# Patient Record
Sex: Female | Born: 1970 | Hispanic: Yes | Marital: Single | Smoking: Never smoker
Health system: Southern US, Community
[De-identification: ages and names within clinical notes are randomized; demographics above are authoritative.]

## PROBLEM LIST (undated history)

## (undated) DIAGNOSIS — I10 Essential (primary) hypertension: Secondary | ICD-10-CM

## (undated) HISTORY — PX: THYROID SURGERY: SHX805

## (undated) HISTORY — PX: CHOLECYSTECTOMY: SHX55

---

## 2018-06-04 ENCOUNTER — Emergency Department: Payer: Medicaid - Out of State

## 2018-06-04 ENCOUNTER — Other Ambulatory Visit: Payer: Self-pay

## 2018-06-04 ENCOUNTER — Emergency Department
Admission: EM | Admit: 2018-06-04 | Discharge: 2018-06-04 | Disposition: A | Discharge status: Home / Self Care | Payer: Medicaid - Out of State | Attending: Emergency Medicine | Admitting: Emergency Medicine

## 2018-06-04 ENCOUNTER — Encounter: Payer: Self-pay | Admitting: Emergency Medicine

## 2018-06-04 DIAGNOSIS — Y92017 Garden or yard in single-family (private) house as the place of occurrence of the external cause: Secondary | ICD-10-CM | POA: Insufficient documentation

## 2018-06-04 DIAGNOSIS — S99912A Unspecified injury of left ankle, initial encounter: Secondary | ICD-10-CM | POA: Diagnosis present

## 2018-06-04 DIAGNOSIS — I10 Essential (primary) hypertension: Secondary | ICD-10-CM | POA: Insufficient documentation

## 2018-06-04 DIAGNOSIS — Y9389 Activity, other specified: Secondary | ICD-10-CM | POA: Insufficient documentation

## 2018-06-04 DIAGNOSIS — Y999 Unspecified external cause status: Secondary | ICD-10-CM | POA: Insufficient documentation

## 2018-06-04 DIAGNOSIS — S60221A Contusion of right hand, initial encounter: Secondary | ICD-10-CM | POA: Insufficient documentation

## 2018-06-04 DIAGNOSIS — W11XXXA Fall on and from ladder, initial encounter: Secondary | ICD-10-CM | POA: Insufficient documentation

## 2018-06-04 DIAGNOSIS — S92012A Displaced fracture of body of left calcaneus, initial encounter for closed fracture: Secondary | ICD-10-CM | POA: Diagnosis not present

## 2018-06-04 DIAGNOSIS — R52 Pain, unspecified: Secondary | ICD-10-CM

## 2018-06-04 HISTORY — DX: Essential (primary) hypertension: I10

## 2018-06-04 LAB — COMPREHENSIVE METABOLIC PANEL
ALT: 38 U/L (ref 0–44)
AST: 39 U/L (ref 15–41)
Albumin: 4.3 g/dL (ref 3.5–5.0)
Alkaline Phosphatase: 80 U/L (ref 38–126)
Anion gap: 7 (ref 5–15)
BILIRUBIN TOTAL: 0.6 mg/dL (ref 0.3–1.2)
BUN: 12 mg/dL (ref 6–20)
CALCIUM: 8.7 mg/dL — AB (ref 8.9–10.3)
CO2: 24 mmol/L (ref 22–32)
CREATININE: 0.54 mg/dL (ref 0.44–1.00)
Chloride: 107 mmol/L (ref 98–111)
Glucose, Bld: 109 mg/dL — ABNORMAL HIGH (ref 70–99)
Potassium: 3.8 mmol/L (ref 3.5–5.1)
Sodium: 138 mmol/L (ref 135–145)
TOTAL PROTEIN: 8.2 g/dL — AB (ref 6.5–8.1)

## 2018-06-04 LAB — CBC WITH DIFFERENTIAL/PLATELET
BASOS ABS: 0 10*3/uL (ref 0–0.1)
BASOS PCT: 1 %
EOS ABS: 0.1 10*3/uL (ref 0–0.7)
EOS PCT: 2 %
HCT: 36 % (ref 35.0–47.0)
HEMOGLOBIN: 12.3 g/dL (ref 12.0–16.0)
Lymphocytes Relative: 14 %
Lymphs Abs: 1.2 10*3/uL (ref 1.0–3.6)
MCH: 27.3 pg (ref 26.0–34.0)
MCHC: 34.1 g/dL (ref 32.0–36.0)
MCV: 80.1 fL (ref 80.0–100.0)
Monocytes Absolute: 0.9 10*3/uL (ref 0.2–0.9)
Monocytes Relative: 10 %
NEUTROS PCT: 73 %
Neutro Abs: 6.3 10*3/uL (ref 1.4–6.5)
PLATELETS: 310 10*3/uL (ref 150–440)
RBC: 4.5 MIL/uL (ref 3.80–5.20)
RDW: 16.1 % — ABNORMAL HIGH (ref 11.5–14.5)
WBC: 8.6 10*3/uL (ref 3.6–11.0)

## 2018-06-04 MED ORDER — HYDROMORPHONE HCL 1 MG/ML IJ SOLN
INTRAMUSCULAR | Status: AC
  Filled 2018-06-04: qty 1

## 2018-06-04 MED ORDER — HYDROMORPHONE HCL 1 MG/ML IJ SOLN
0.5000 mg | Freq: Once | INTRAMUSCULAR | Status: AC
  Administered 2018-06-04: 0.5 mg via INTRAVENOUS

## 2018-06-04 MED ORDER — HYDROMORPHONE HCL 1 MG/ML IJ SOLN
0.5000 mg | Freq: Once | INTRAMUSCULAR | Status: AC
  Administered 2018-06-04: 0.5 mg via INTRAVENOUS
  Filled 2018-06-04: qty 1

## 2018-06-04 MED ORDER — OXYCODONE-ACETAMINOPHEN 5-325 MG PO TABS: 1.0000 | ORAL_TABLET | Freq: Four times a day (QID) | ORAL | 0 refills | Status: AC | PRN

## 2018-06-04 NOTE — Discharge Instructions (Addendum)
[facility]. [facility]ins[facility]owler [facility] on Tuesday 9/24 at 3:45pm.    Ice and elevate foot until seen by podiatry.  Take medication every 6 hours as needed for pain.  Use walker for walking and do not apply weight to your foot.  Bring $100 to the office for visit on Tuesday.  Also [place] for possible referral to podiatry.

## 2018-06-04 NOTE — ED Notes (Signed)
Posterior ocl to left ankle.  Prior to splint webril and ace.  Then splint and ace.  Tolerated well.  Pain 5/10.  All instructions were given via [employer] interpreter maritza.  Including how to use walker and no wt bearing.

## 2018-06-04 NOTE — ED Provider Notes (Signed)
[facility] Emergency Department Provider Note  ____________________________________________   First MD Initiated Contact with Patient 06/04/18 1023     (approximate)  I have reviewed the triage vital signs and the nursing notes.   HISTORY  Chief Complaint Ankle Pain Patient, husband and Spanish interpreter  HPI Anne Lane is a 47 y.o. female is brought in day by her husband after she fell at approximately 10 AM from a ladder while she was cleaning out the gutters.  Patient states that she landed mostly on the left side.  She denies any head injury or loss of consciousness.  Patient states that she fell into the grass.  She has had pain with her ankle since that time.  She also complains of some minimal pain to her right hand.  She denies any dizziness, visual changes, headache, chest pain, nausea, vomiting or abdominal pain.  Patient denies any previous injury to her ankle.  She rates her pain as a 9/10.   Past Medical History:  Diagnosis Date  . Hypertension     There are no active problems to display for this patient.   Past Surgical History:  Procedure Laterality Date  . CHOLECYSTECTOMY    . THYROID SURGERY      Prior to Admission medications   Medication Sig Start Date End Date Taking? Authorizing Provider  oxyCODONE-acetaminophen (PERCOCET) 5-325 MG tablet Take 1 tablet by mouth every 6 (six) hours as needed for severe pain. 06/04/18   Tommi Rumps, PA-C    Allergies Patient has no known allergies.  No family history on file.  Social History Social History   Tobacco Use  . Smoking status: Never Smoker  . Smokeless tobacco: Never Used  Substance Use Topics  . Alcohol use: Not Currently  . Drug use: Not Currently    Review of Systems Constitutional: No fever/chills Eyes: No visual changes. ENT: No trauma. Cardiovascular: Denies chest pain. Respiratory: Denies shortness of breath. Gastrointestinal: No abdominal  pain.  No nausea, no vomiting.   Musculoskeletal: Negative for back pain.  Negative for cervical pain, hip pain, shoulder pain or right leg pain.  Positive for left ankle/foot and right hand pain. Skin: Negative for lacerations or abrasions. Neurological: Negative for headaches, focal weakness or numbness. ___________________________________________   PHYSICAL EXAM:  VITAL SIGNS: ED Triage Vitals  Enc Vitals Group     BP 06/04/18 1014 (!) 166/84     Pulse Rate 06/04/18 1014 79     Resp 06/04/18 1014 18     Temp 06/04/18 1014 98.3 F (36.8 C)     Temp Source 06/04/18 1014 Oral     SpO2 06/04/18 1014 98 %     Weight 06/04/18 1006 150 lb (68 kg)     Height 06/04/18 1006 5\' 4"  (1.626 m)     Head Circumference --      Peak Flow --      Pain Score 06/04/18 1006 9     Pain Loc --      Pain Edu? --      Excl. in GC? --    Constitutional: Alert and oriented. Well appearing and in no acute distress. Eyes: Conjunctivae are normal. PERRL. EOMI. Head: Atraumatic. Nose: No trauma. Neck: No stridor.  No cervical spine tenderness on palpation posteriorly.  Range of motion is without restriction or pain. Cardiovascular: Normal rate, regular rhythm. Grossly normal heart sounds.  Good peripheral circulation. Respiratory: Normal respiratory effort.  No retractions. Lungs CTAB.  Nontender  ribs to palpation posteriorly, laterally or anteriorly.  Soft tissue injury is noted. Gastrointestinal: Soft and nontender. No distention.  No CVA tenderness.  Bowel sounds normal x4 quadrants.  There is no bruising noted. Musculoskeletal: On examination of the left ankle there is marked tenderness and edema noted on the lateral aspect and also the plantar aspect of the left foot.  Skin is intact.  Motor or sensory function intact.  Capillary refill is less than 3 seconds.  Patient has limited range of motion secondary to pain.  There is no tenderness on palpation of the tibia, left knee, femur or compression of the  left hip and pelvis.  Patient is nontender to palpation of the thoracic and lumbar spine.  No soft tissue injury or abrasions were noted.  There is minimal tenderness on palpation of the right first metacarpal.  No soft tissue edema is present.  Patient is able to move all digits without any difficulty and also make a fist.  Capillary refill is less than 3 seconds.  Skin is intact.  Patient is nontender palpation of the wrist, elbow or right shoulder. Neurologic:  Normal speech and language. No gross focal neurologic deficits are appreciated. No gait instability. Skin:  Skin is warm, dry and intact.  No ecchymosis, abrasions, erythema present. Psychiatric: Mood and affect are normal. Speech and behavior are normal.  ____________________________________________   LABS (all labs ordered are listed, but only abnormal results are displayed)  Labs Reviewed  COMPREHENSIVE METABOLIC PANEL - Abnormal; Notable for the following components:      Result Value   Glucose, Bld 109 (*)    Calcium 8.7 (*)    Total Protein 8.2 (*)    All other components within normal limits  CBC WITH DIFFERENTIAL/PLATELET - Abnormal; Notable for the following components:   RDW 16.1 (*)    All other components within normal limits    RADIOLOGY  ED MD interpretation:  Left ankle x-ray is suggestive of a calcaneal fracture.  Dedicated calcaneal films were ordered and confirmed fracture.  Official radiology report(s): Dg Ankle Complete Left  Result Date: 06/04/2018 CLINICAL DATA:  Left ankle pain and swelling. EXAM: LEFT ANKLE COMPLETE - 3+ VIEW COMPARISON:  No recent. FINDINGS: Diffuse soft tissue swelling. Severely comminuted displaced fractures of the calcaneus noted. Fractures extend into the talocalcaneal joint space. IMPRESSION: Severely comminuted and displaced fractures of the calcaneus. Fractures extend into talocalcaneal joint space. Electronically Signed   By: Maisie Fushomas  Register   On: 06/04/2018 10:53   Dg Os  Calcis Left  Result Date: 06/04/2018 CLINICAL DATA:  Patient fell from a ladder while cleaning gutters approximately 30 minutes prior to presentation. The patient is complaining of left ankle pain. EXAM: LEFT OS CALCIS - 2+ VIEW COMPARISON:  Left ankle series of today's date FINDINGS: The patient has sustained a comminuted fracture of the body of the calcaneus. The articulation with the talus appears intact. The other visualized tarsal bones are normal. The distal tibia and fibula appear normal. IMPRESSION: The patient has sustained an acute comminuted fracture of the calcaneus. There is a large amount of soft tissue swelling. Electronically Signed   By: David  SwazilandJordan M.D.   On: 06/04/2018 10:53   Ct Ankle Left Wo Contrast  Result Date: 06/04/2018 CLINICAL DATA:  Evaluate calcaneus fracture.  Fell from a ladder. EXAM: CT OF THE LEFT ANKLE WITHOUT CONTRAST TECHNIQUE: Multidetector CT imaging of the left ankle was performed according to the standard protocol. Multiplanar CT image reconstructions were  also generated. COMPARISON:  Radiographs 06/04/2018 FINDINGS: Severely comminuted and displaced fractures of the calcaneus which has a shattered appearance. This is largely a central depression type fracture with marked depression and posterior rotation of the posterior calcaneal facet laterally. The sustentacular limb is largely intact. Small fractures posteriorly and near the base. The middle talocalcaneal facet is intact. There is a fracture through the base of the anterior process. The tibiotalar joint is maintained. No fracture of the tibia or fibula is identified. No talar fracture. The tarsal bones are intact. IMPRESSION: 1. Severely comminuted and displaced central depression type calcaneal fractures. 2. No other fractures are identified. Electronically Signed   By: Rudie Meyer M.D.   On: 06/04/2018 12:47   Dg Hand Complete Right  Result Date: 06/04/2018 CLINICAL DATA:  Fell 10 feet.  Right hand pain.  EXAM: RIGHT HAND - COMPLETE 3+ VIEW COMPARISON:  None. FINDINGS: Negative for fracture or dislocation in the right hand. No focal soft tissue abnormality. No significant arthropathy. IMPRESSION: No acute bone abnormality to the right hand. Electronically Signed   By: Richarda Overlie M.D.   On: 06/04/2018 12:29   ____________________________________________   PROCEDURES  Procedure(s) performed:   .Splint Application Date/Time: 06/04/2018 3:48 PM Performed by: Sheran Luz, RN Authorized by: Tommi Rumps, PA-C   Consent:    Consent obtained:  Verbal   Consent given by:  Patient and spouse   Risks discussed:  Pain, numbness and swelling   Alternatives discussed:  Referral Pre-procedure details:    Sensation:  Normal   Skin color:  No ecchymosis, abrasions or erythema. Procedure details:    Laterality:  Left   Location:  Ankle   Ankle:  L ankle   Strapping: no     Splint type:  Short leg   Supplies:  Ortho-Glass Post-procedure details:    Pain:  Improved   Sensation:  Normal   Patient tolerance of procedure:  Tolerated well, no immediate complications  Critical Care performed: No ____________________________________________   INITIAL IMPRESSION / ASSESSMENT AND PLAN / ED COURSE  As part of my medical decision making, I reviewed the following data within the electronic MEDICAL RECORD NUMBER Notes from prior ED visits and [place] Controlled Substance Database  Patient presents to the ED with complaint of left ankle pain and right hand pain after she fell off a ladder while cleaning the gutters.  Patient denied any other injuries.  History was obtained with Spanish interpreter present.  Patient denies any head injury or loss of consciousness and spouse is present and confirms the story also.  X-rays of the left ankle is suggestive for a calcaneal fracture and was confirmed with a dedicated film.  CT scan of the calcaneus was obtained.  Right hand x-ray was negative.  Patient was given  Dilaudid 0.5 mg IV prior to x-ray and did not require any further pain medication until the splint was placed.  Dr. Ether Griffins will see the patient in the office on 06/09/2018 at 3:45 PM.  Patient was given a walker and instructed that she is not to bear weight on this foot.  She is also to ice and elevate her foot for the weekend.  Patient was given a prescription for Percocet 1 every 6 hours as needed for pain.  Patient and husband agreed that they will comply with instructions.  ____________________________________________   FINAL CLINICAL IMPRESSION(S) / ED DIAGNOSES  Final diagnoses:  Pain  Closed displaced fracture of body of left calcaneus, initial encounter  Contusion of right hand, initial encounter  Fall from ladder, initial encounter     ED Discharge Orders         Ordered    oxyCODONE-acetaminophen (PERCOCET) 5-325 MG tablet  Every 6 hours PRN     06/04/18 1450           Note:  This document was prepared using Dragon voice recognition software and may include unintentional dictation errors.    Tommi Rumps, PA-C 06/04/18 1741    Phineas Semen, MD 06/05/18 [phone]

## 2018-06-04 NOTE — ED Notes (Signed)
Patient on stretcher with husband at bedside.  Appears relaxed now.  Has foot up on pillow and ice pack in place.

## 2018-06-04 NOTE — ED Triage Notes (Addendum)
Patient to ED via POV into WC.  Patient speaks Spanish, [employer] tradernterpreter used. Patient fell from a ladder this AM while cleaning gutters approximately 10 ft.  This occurred approx. 30 min. Ago, Patient states she landed on her feet mostly on the left side, states she fell onto the grass.  Complaining of pain left ankle and right hand.  Denies LOC, denies back or neck pain at this time. Left ankle visibly swollen.  Ice pack applied.

## 2018-11-08 IMAGING — CT CT ANKLE*L* W/O CM
3 series · 13 of 36 positions shown, 14 images · non-contrast
Comparison: Radiographs 06/04/2018

CLINICAL DATA: Evaluate calcaneus fracture.  Fell from a ladder.

EXAM:
CT OF THE LEFT ANKLE WITHOUT CONTRAST
TECHNIQUE: Multidetector CT imaging of the left ankle was performed according
to the standard protocol. Multiplanar CT image reconstructions were
also generated.

[Series 5: axial st · axial · 0.20mm/px · z∈[+58,+174]mm · 4 of 168 slices shown, 5 images]
[im 26/168  soft-tissue]
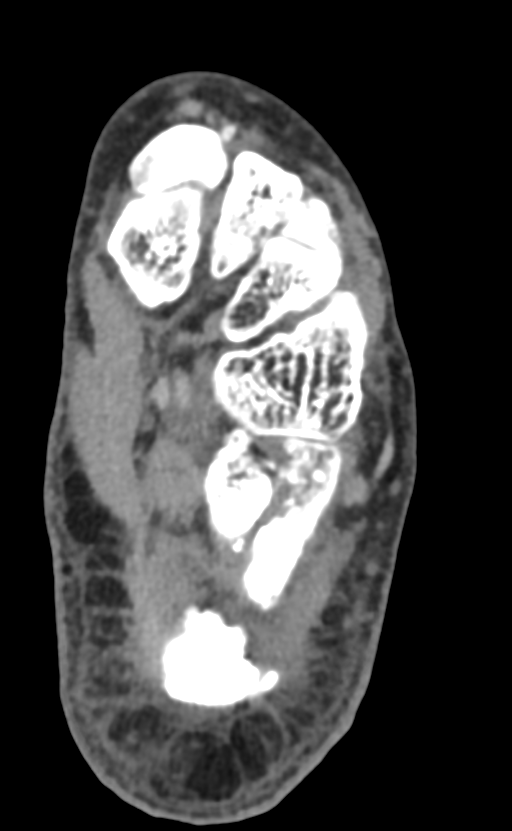
[im 26/168  bone]
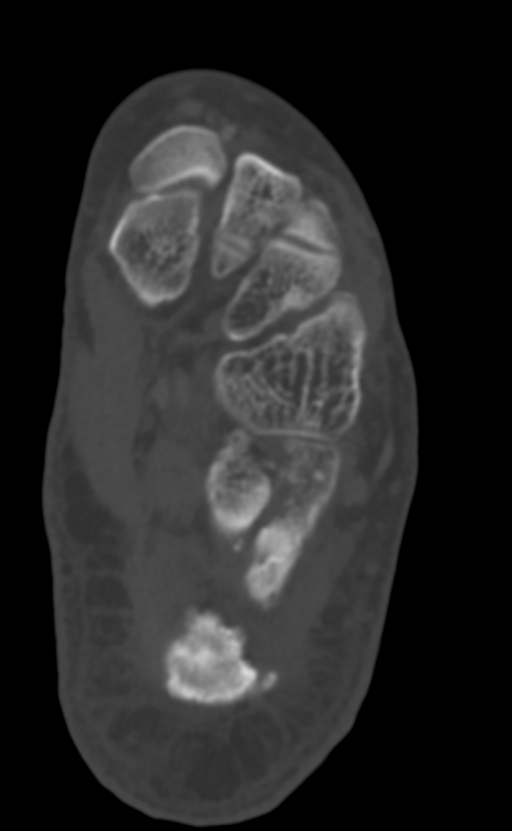
[im 65/168  bone]
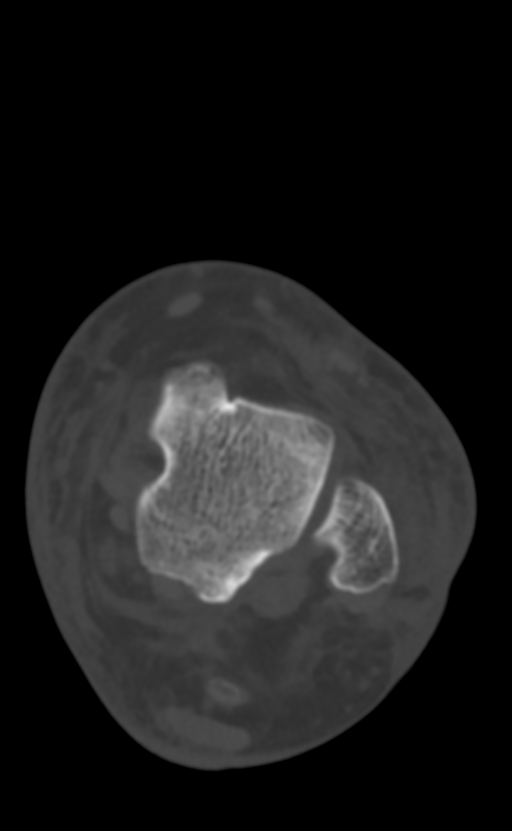
[im 103/168  bone]
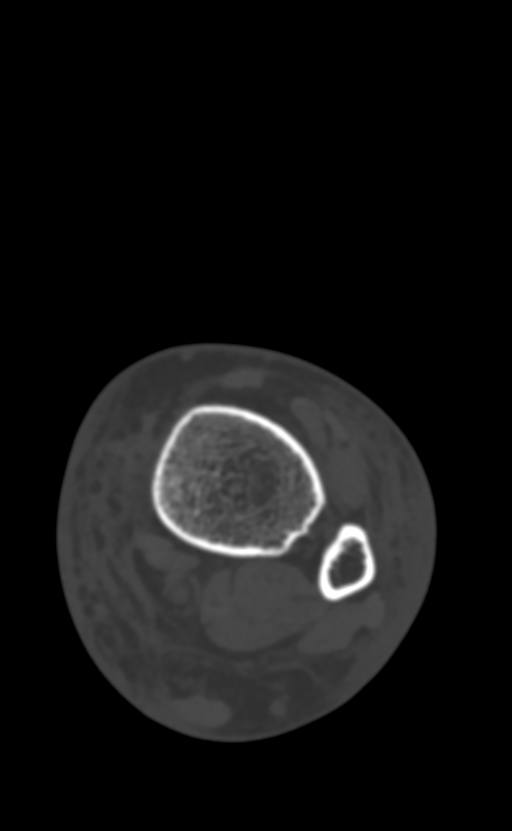
[im 142/168  bone]
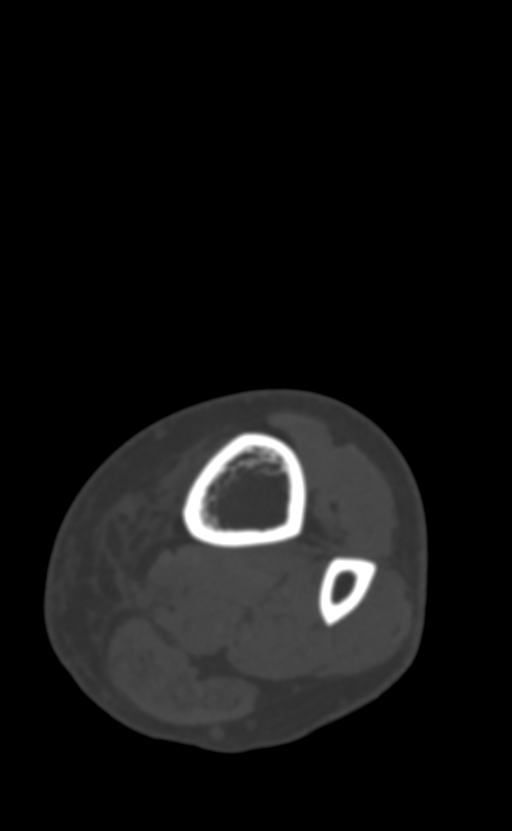

[Series 7: cor st · coronal · 0.19mm/px · 3 of 165 slices shown]
[im 33/165  bone]
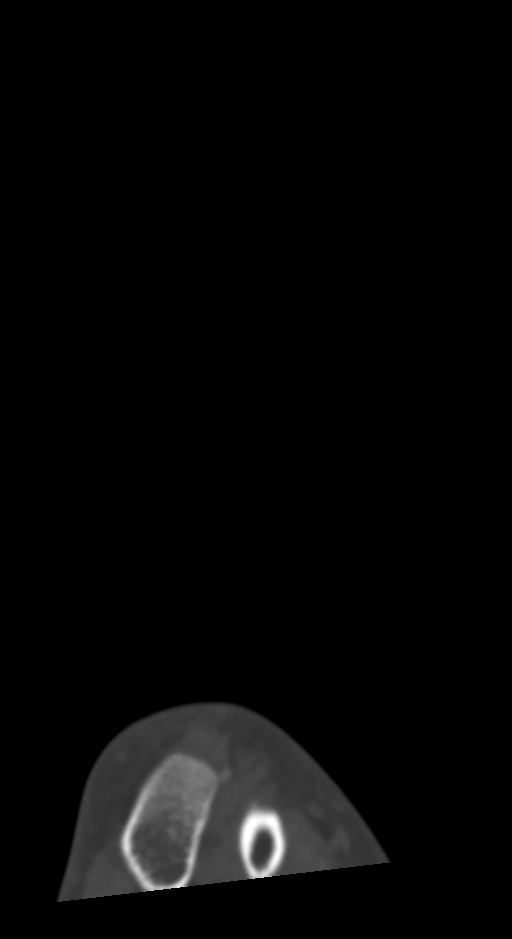
[im 66/165  bone]
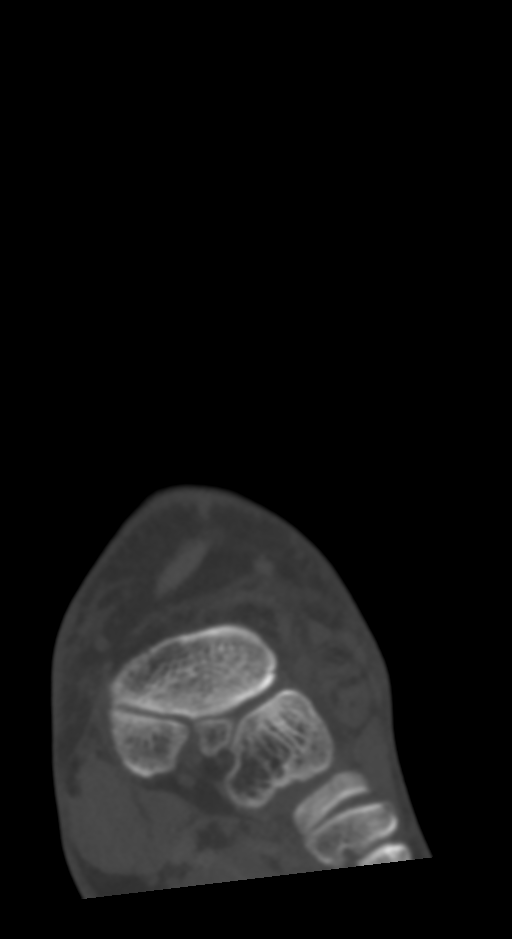
[im 99/165  bone]
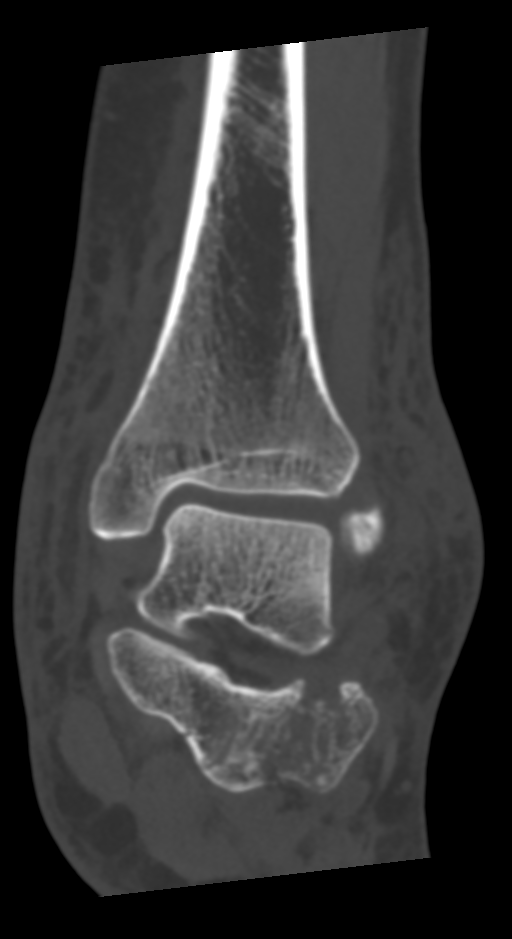

[Series 9: sag st · sagittal · 0.34mm/px · 6 of 95 slices shown]
[im 32/95  bone]
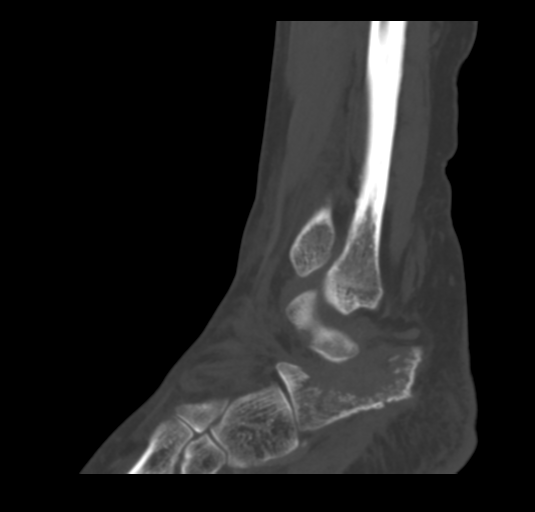
[im 40/95  bone]
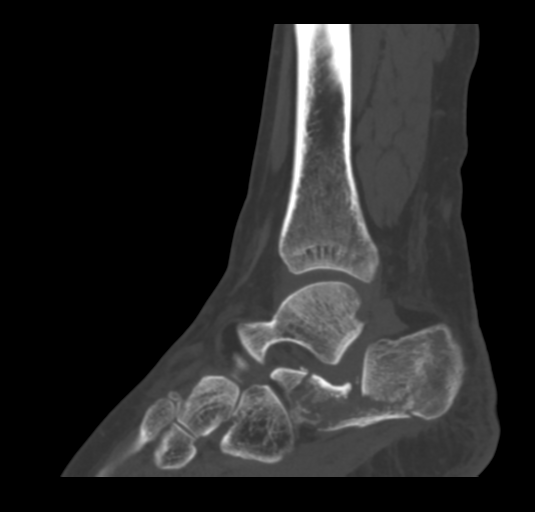
[im 47/95  soft-tissue]
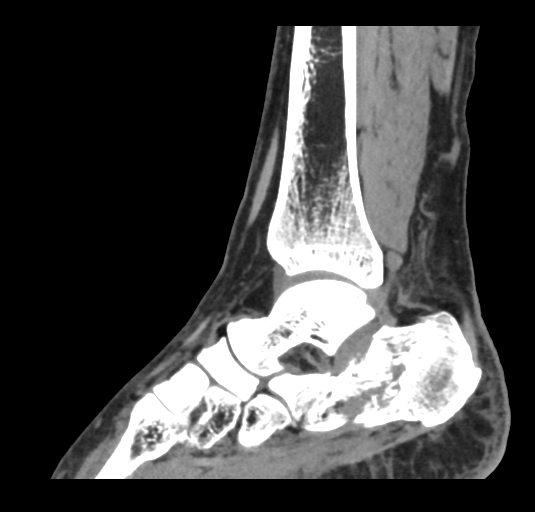
[im 48/95  bone]
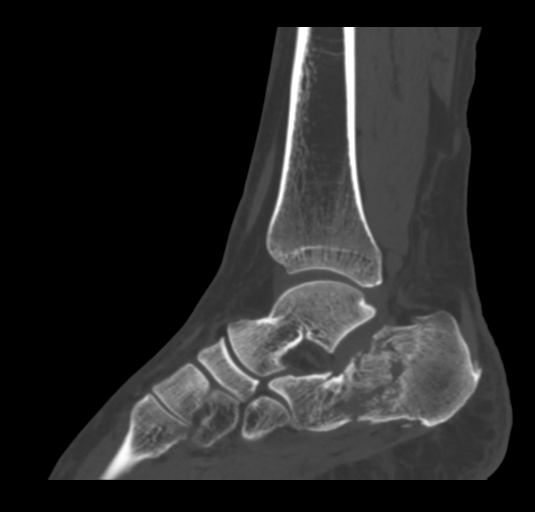
[im 55/95  bone]
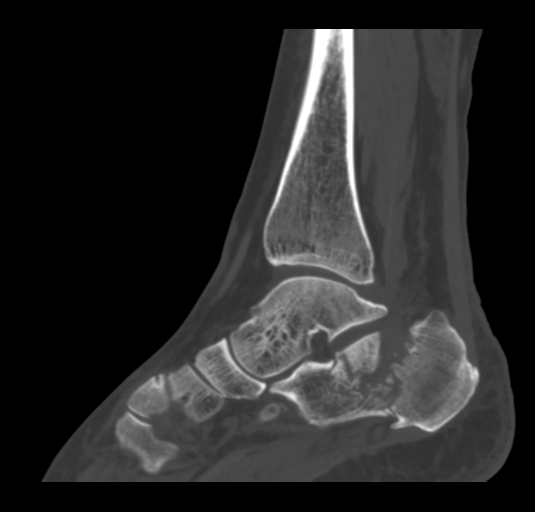
[im 63/95  bone]
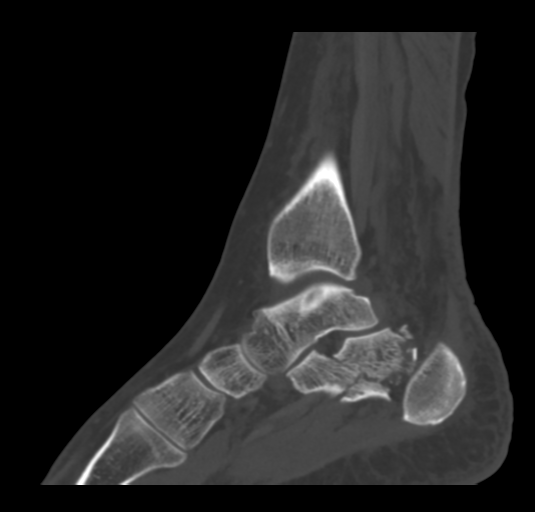

[13 of 36 positions shown; findings below may reference images not displayed]

FINDINGS: Severely comminuted and displaced fractures of the calcaneus which
has a shattered appearance. This is largely a central depression
type fracture with marked depression and posterior rotation of the
posterior calcaneal facet laterally. The sustentacular limb is
largely intact. Small fractures posteriorly and near the base. The
middle talocalcaneal facet is intact. There is a fracture through
the base of the anterior process.

The tibiotalar joint is maintained. No fracture of the tibia or
fibula is identified. No talar fracture. The tarsal bones are
intact.
IMPRESSION: 1. Severely comminuted and displaced central depression type
calcaneal fractures.
2. No other fractures are identified.

## 2020-05-21 ENCOUNTER — Emergency Department
Admission: EM | Admit: 2020-05-21 | Discharge: 2020-05-21 | Disposition: A | Discharge status: Home / Self Care | Payer: Medicaid - Out of State | Attending: Emergency Medicine | Admitting: Emergency Medicine

## 2020-05-21 ENCOUNTER — Other Ambulatory Visit: Payer: Self-pay

## 2020-05-21 DIAGNOSIS — Y93G1 Activity, food preparation and clean up: Secondary | ICD-10-CM | POA: Insufficient documentation

## 2020-05-21 DIAGNOSIS — Z79899 Other long term (current) drug therapy: Secondary | ICD-10-CM | POA: Insufficient documentation

## 2020-05-21 DIAGNOSIS — W25XXXA Contact with sharp glass, initial encounter: Secondary | ICD-10-CM | POA: Insufficient documentation

## 2020-05-21 DIAGNOSIS — Y998 Other external cause status: Secondary | ICD-10-CM | POA: Insufficient documentation

## 2020-05-21 DIAGNOSIS — S0501XA Injury of conjunctiva and corneal abrasion without foreign body, right eye, initial encounter: Secondary | ICD-10-CM | POA: Insufficient documentation

## 2020-05-21 DIAGNOSIS — Y9289 Other specified places as the place of occurrence of the external cause: Secondary | ICD-10-CM | POA: Insufficient documentation

## 2020-05-21 DIAGNOSIS — I10 Essential (primary) hypertension: Secondary | ICD-10-CM | POA: Insufficient documentation

## 2020-05-21 MED ORDER — TETRACAINE HCL 0.5 % OP SOLN
2.0000 [drp] | Freq: Once | OPHTHALMIC | Status: DC
  Filled 2020-05-21: qty 4

## 2020-05-21 MED ORDER — POLYMYXIN B-TRIMETHOPRIM 10000-0.1 UNIT/ML-% OP SOLN: 2.0000 [drp] | Freq: Four times a day (QID) | OPHTHALMIC | 0 refills | Status: AC

## 2020-05-21 MED ORDER — FLUORESCEIN SODIUM 1 MG OP STRP
1.0000 | ORAL_STRIP | Freq: Once | OPHTHALMIC | Status: DC
  Filled 2020-05-21: qty 1

## 2020-05-21 MED ORDER — KETOROLAC TROMETHAMINE 0.5 % OP SOLN: 1.0000 [drp] | Freq: Four times a day (QID) | OPHTHALMIC | 0 refills | Status: AC

## 2020-05-21 NOTE — ED Triage Notes (Signed)
Info obtained using amn interpreter [phone].  Patient reports she was washing a glass and dropped it and concerned something got in her eye.

## 2020-05-21 NOTE — ED Provider Notes (Signed)
[facility] Emergency Department Provider Note  ____________________________________________  Time seen: Approximately 9:55 PM  I have reviewed the triage vital signs and the nursing notes.   HISTORY  Chief Complaint Eye Pain    HPI Anne Lane is a 49 y.o. female who presents the emergency department complaining of right eye pain. Patient states that she was washing dishes, glass slipped and shattered. She believes that something flew into her right eye. Patient flushed her eye but still feels like there is a foreign body in the eye. No vision changes. No glasses or contacts. Patient does not take any medication prior to arrival.         Past Medical History:  Diagnosis Date   Hypertension     There are no problems to display for this patient.   Past Surgical History:  Procedure Laterality Date   CHOLECYSTECTOMY     THYROID SURGERY      Prior to Admission medications   Medication Sig Start Date End Date Taking? Authorizing Provider  ketorolac (ACULAR) 0.5 % ophthalmic solution Place 1 drop into the right eye 4 (four) times daily. 05/21/20   Airianna Kreischer, Delorise Royals, PA-C  oxyCODONE-acetaminophen (PERCOCET) 5-325 MG tablet Take 1 tablet by mouth every 6 (six) hours as needed for severe pain. 06/04/18   Tommi Rumps, PA-C  trimethoprim-polymyxin b (POLYTRIM) ophthalmic solution Place 2 drops into the right eye every 6 (six) hours. 05/21/20   Nadirah Socorro, Delorise Royals, PA-C    Allergies Patient has no known allergies.  No family history on file.  Social History Social History   Tobacco Use   Smoking status: Never Smoker   Smokeless tobacco: Never Used  Substance Use Topics   Alcohol use: Not Currently   Drug use: Not Currently     Review of Systems  Constitutional: No fever/chills Eyes: No visual changes. No discharge. Positive for foreign body sensation after possible piece of glass struck the patient's right eye ENT: No  upper respiratory complaints. Cardiovascular: no chest pain. Respiratory: no cough. No SOB. Gastrointestinal: No abdominal pain.  No nausea, no vomiting.  No diarrhea.  No constipation. Musculoskeletal: Negative for musculoskeletal pain. Skin: Negative for rash, abrasions, lacerations, ecchymosis. Neurological: Negative for headaches, focal weakness or numbness. 10-point ROS otherwise negative.  ____________________________________________   PHYSICAL EXAM:  VITAL SIGNS: ED Triage Vitals  Enc Vitals Group     BP 05/21/20 2045 (!) 148/97     Pulse Rate 05/21/20 2045 88     Resp 05/21/20 2045 17     Temp 05/21/20 2045 98.6 F (37 C)     Temp Source 05/21/20 2045 Oral     SpO2 05/21/20 2045 97 %     Weight 05/21/20 2043 159 lb (72.1 kg)     Height 05/21/20 2043 4\' 11"  (1.499 m)     Head Circumference --      Peak Flow --      Pain Score --      Pain Loc --      Pain Edu? --      Excl. in GC? --      Constitutional: Alert and oriented. Well appearing and in no acute distress. Eyes: Conjunctivae are normal. PERRL. EOMI. visualization of the right eye reveals no acute traumatic findings with funduscopic exam. No evidence of globe trauma with hyphema. Red reflex present. Vasculature and optic discs unremarkable. Tetracaine applied for good anesthesia. Fluorescein staining applied with area of uptake in the 5 and 6  o'clock position. No visualized foreign body. Head: Atraumatic. ENT:      Ears:       Nose: No congestion/rhinnorhea.      Mouth/Throat: Mucous membranes are moist.  Neck: No stridor.    Cardiovascular: Normal rate, regular rhythm. Normal S1 and S2.  Good peripheral circulation. Respiratory: Normal respiratory effort without tachypnea or retractions. Lungs CTAB. Good air entry to the bases with no decreased or absent breath sounds. Musculoskeletal: Full range of motion to all extremities. No gross deformities appreciated. Neurologic:  Normal speech and language. No  gross focal neurologic deficits are appreciated.  Skin:  Skin is warm, dry and intact. No rash noted. Psychiatric: Mood and affect are normal. Speech and behavior are normal. Patient exhibits appropriate insight and judgement.   ____________________________________________   LABS (all labs ordered are listed, but only abnormal results are displayed)  Labs Reviewed - No data to display ____________________________________________  EKG   ____________________________________________  RADIOLOGY   No results found.  ____________________________________________    PROCEDURES  Procedure(s) performed:    Procedures    Medications  fluorescein ophthalmic strip 1 strip (has no administration in time range)  tetracaine (PONTOCAINE) 0.5 % ophthalmic solution 2 drop (has no administration in time range)     ____________________________________________   INITIAL IMPRESSION / ASSESSMENT AND PLAN / ED COURSE  Pertinent labs & imaging results that were available during my care of the patient were reviewed by me and considered in my medical decision making (see chart for details).  Review of the [place] CSRS was performed in accordance of the [facility] prior to dispensing any controlled drugs.           Patient's diagnosis is consistent with corneal abrasion. Patient presented to emergency department complaining of right eye pain after possible piece of broken glass struck her right eye. She had foreign body sensation but no evidence of foreign body. No evidence of globe trauma. Patient has findings consistent with corneal abrasion. Polytrim and Acular prescribed for symptom relief. Follow-up with ophthalmology or primary care as needed..  Patient is given ED precautions to return to the ED for any worsening or new symptoms.     ____________________________________________  FINAL CLINICAL IMPRESSION(S) / ED DIAGNOSES  Final diagnoses:  Abrasion of right cornea, initial encounter       NEW MEDICATIONS STARTED DURING THIS VISIT:  ED Discharge Orders         Ordered    trimethoprim-polymyxin b (POLYTRIM) ophthalmic solution  Every 6 hours        05/21/20 2310    ketorolac (ACULAR) 0.5 % ophthalmic solution  4 times daily        05/21/20 2310              This chart was dictated using voice recognition software/Dragon. Despite best efforts to proofread, errors can occur which can change the meaning. Any change was purely unintentional.    Racheal Patches, PA-C 05/22/20 0027    Delton Prairie, MD 05/23/20 2104

## 2022-04-12 ENCOUNTER — Other Ambulatory Visit: Payer: Self-pay

## 2022-04-12 DIAGNOSIS — N63 Unspecified lump in unspecified breast: Secondary | ICD-10-CM

## 2022-04-16 ENCOUNTER — Ambulatory Visit: Payer: Medicaid - Out of State

## 2022-04-16 ENCOUNTER — Telehealth: Payer: Self-pay | Admitting: *Deleted

## 2022-04-30 ENCOUNTER — Inpatient Hospital Stay
Admission: RE | Admit: 2022-04-30 | Discharge: 2022-04-30 | Disposition: A | Discharge status: Home / Self Care | Payer: Self-pay | Source: Ambulatory Visit | Attending: *Deleted | Admitting: *Deleted

## 2022-04-30 ENCOUNTER — Other Ambulatory Visit: Payer: Self-pay | Admitting: *Deleted

## 2022-04-30 DIAGNOSIS — Z1231 Encounter for screening mammogram for malignant neoplasm of breast: Secondary | ICD-10-CM

## 2022-05-17 ENCOUNTER — Other Ambulatory Visit: Payer: Self-pay

## 2022-05-17 ENCOUNTER — Ambulatory Visit: Payer: Medicaid - Out of State | Attending: Hematology and Oncology | Admitting: Hematology and Oncology

## 2022-05-17 VITALS — BP 178/87 | Wt 164.0 lb

## 2022-05-17 DIAGNOSIS — N6310 Unspecified lump in the right breast, unspecified quadrant: Secondary | ICD-10-CM

## 2022-05-17 DIAGNOSIS — Z1211 Encounter for screening for malignant neoplasm of colon: Secondary | ICD-10-CM

## 2022-05-17 NOTE — Progress Notes (Addendum)
Anne Lane is a 51 y.o. female who presents to [facility] clinic today with complaint of right breast lump.    Pap Smear: Pap not smear completed today. Last Pap smear was 01/06/2022 at [facility] clinic and was normal with positive HPV. She was noted to have normal pap with negative HPV in 2021 and positive HPV in 2020. Per patient has no history of an abnormal Pap smear. Last Pap smear result is not available in Epic.   Physical exam: Breasts Breasts symmetrical. No skin abnormalities bilateral breasts. No nipple retraction bilateral breasts. No nipple discharge bilateral breasts. No lymphadenopathy. Right breast lump, approximately 2 cm x 2 cm at 11 o'clock. Left breast is benign.     Pelvic/Bimanual Pap is not indicated today    Smoking History: Patient has never smoked and was not referred to quit line.    Patient Navigation: Patient education provided. Access to services provided for patient through [facility] program. Koleen Nimrod [phone] interpreter provided. No transportation provided   Colorectal Cancer Screening: Per patient has never had colonoscopy completed No complaints today. FIT test given today.   Breast and Cervical Cancer Risk Assessment: Patient does not have family history of breast cancer, known genetic mutations, or radiation treatment to the chest before age 51. Patient does not have history of cervical dysplasia, immunocompromised, or DES exposure in-utero.  Risk Assessment   No risk assessment data     A: BCCCP exam without pap smear Complaint of right breast lump. This is noted on exam at 11 o'clock right breast. This has been noted for a couple of years without follow up. We will obtain diagnostic imaging.   P: Referred patient to [facility] for a diagnostic mammogram. Appointment scheduled 05/29/2022.  Pascal Lux, NP 05/17/2022 8:44 AM

## 2022-05-29 ENCOUNTER — Ambulatory Visit
Admission: RE | Admit: 2022-05-29 | Discharge: 2022-05-29 | Disposition: A | Discharge status: Home / Self Care | Payer: Self-pay | Source: Ambulatory Visit | Attending: Obstetrics and Gynecology | Admitting: Obstetrics and Gynecology

## 2022-05-29 DIAGNOSIS — N63 Unspecified lump in unspecified breast: Secondary | ICD-10-CM | POA: Insufficient documentation

## 2022-12-16 ENCOUNTER — Telehealth: Payer: Self-pay | Admitting: *Deleted

## 2023-01-27 ENCOUNTER — Telehealth: Payer: Self-pay | Admitting: *Deleted

## 2023-01-27 ENCOUNTER — Ambulatory Visit: Payer: Self-pay | Attending: Hematology and Oncology

## 2023-03-11 ENCOUNTER — Other Ambulatory Visit: Payer: Self-pay

## 2023-03-11 DIAGNOSIS — R079 Chest pain, unspecified: Secondary | ICD-10-CM | POA: Insufficient documentation

## 2023-03-11 DIAGNOSIS — R350 Frequency of micturition: Secondary | ICD-10-CM | POA: Insufficient documentation

## 2023-03-11 DIAGNOSIS — R110 Nausea: Secondary | ICD-10-CM | POA: Insufficient documentation

## 2023-03-11 DIAGNOSIS — R1012 Left upper quadrant pain: Secondary | ICD-10-CM | POA: Insufficient documentation

## 2023-03-11 DIAGNOSIS — Z5321 Procedure and treatment not carried out due to patient leaving prior to being seen by health care provider: Secondary | ICD-10-CM | POA: Insufficient documentation

## 2023-03-11 LAB — URINALYSIS, ROUTINE W REFLEX MICROSCOPIC
Bilirubin Urine: NEGATIVE
Glucose, UA: NEGATIVE mg/dL
Hgb urine dipstick: NEGATIVE
Ketones, ur: NEGATIVE mg/dL
Leukocytes,Ua: NEGATIVE
Nitrite: NEGATIVE
Protein, ur: 30 mg/dL — AB
Specific Gravity, Urine: 1.01 (ref 1.005–1.030)
pH: 6 (ref 5.0–8.0)

## 2023-03-11 LAB — CBC
HCT: 36.4 % (ref 36.0–46.0)
Hemoglobin: 11.5 g/dL — ABNORMAL LOW (ref 12.0–15.0)
MCH: 24.2 pg — ABNORMAL LOW (ref 26.0–34.0)
MCHC: 31.6 g/dL (ref 30.0–36.0)
MCV: 76.6 fL — ABNORMAL LOW (ref 80.0–100.0)
Platelets: 329 10*3/uL (ref 150–400)
RBC: 4.75 MIL/uL (ref 3.87–5.11)
RDW: 16.4 % — ABNORMAL HIGH (ref 11.5–15.5)
WBC: 6.9 10*3/uL (ref 4.0–10.5)
nRBC: 0 % (ref 0.0–0.2)

## 2023-03-11 LAB — COMPREHENSIVE METABOLIC PANEL
ALT: 28 U/L (ref 0–44)
AST: 26 U/L (ref 15–41)
Albumin: 4.1 g/dL (ref 3.5–5.0)
Alkaline Phosphatase: 95 U/L (ref 38–126)
Anion gap: 10 (ref 5–15)
BUN: 13 mg/dL (ref 6–20)
CO2: 23 mmol/L (ref 22–32)
Calcium: 9.1 mg/dL (ref 8.9–10.3)
Chloride: 107 mmol/L (ref 98–111)
Creatinine, Ser: 0.81 mg/dL (ref 0.44–1.00)
GFR, Estimated: >60 mL/min (ref >60)
Glucose, Bld: 150 mg/dL — ABNORMAL HIGH (ref 70–99)
Potassium: 3.2 mmol/L — ABNORMAL LOW (ref 3.5–5.1)
Sodium: 140 mmol/L (ref 135–145)
Total Bilirubin: 0.7 mg/dL (ref 0.3–1.2)
Total Protein: 7.7 g/dL (ref 6.5–8.1)

## 2023-03-11 LAB — POC URINE PREG, ED: Preg Test, Ur: NEGATIVE

## 2023-03-11 LAB — TROPONIN I (HIGH SENSITIVITY): Troponin I (High Sensitivity): 6 ng/L (ref <18)

## 2023-03-11 LAB — LIPASE, BLOOD: Lipase: 36 U/L (ref 11–51)

## 2023-03-11 NOTE — ED Triage Notes (Signed)
Pt to ED via POV c/o left upper abd pain since yesterday and chest pain that started today. Pt reporting "throbbing-like" pain to LUQ and describes central chest pain as squeezing. Pt endorses nausea and urinary frequency. Pt does not have gallbladder. Denies SOB, dizziness, fevers

## 2023-03-12 ENCOUNTER — Emergency Department
Admission: EM | Admit: 2023-03-12 | Discharge: 2023-03-12 | Discharge status: Against Medical Advice | Payer: Self-pay | Attending: Emergency Medicine | Admitting: Emergency Medicine

## 2023-03-12 NOTE — ED Notes (Signed)
No answer when called several times from lobby 

## 2023-06-13 LAB — COLOGUARD: COLOGUARD: NEGATIVE
